# Patient Record
Sex: Male | Born: 1935 | Race: White | Hispanic: No
Health system: Southern US, Community
[De-identification: ages and names within clinical notes are randomized; demographics above are authoritative.]

## PROBLEM LIST (undated history)

## (undated) DIAGNOSIS — C73 Malignant neoplasm of thyroid gland: Secondary | ICD-10-CM

---

## 2012-08-26 ENCOUNTER — Other Ambulatory Visit (HOSPITAL_COMMUNITY): Payer: Self-pay | Admitting: Internal Medicine

## 2012-08-26 DIAGNOSIS — C73 Malignant neoplasm of thyroid gland: Secondary | ICD-10-CM

## 2012-09-02 ENCOUNTER — Encounter (HOSPITAL_COMMUNITY)
Admission: RE | Admit: 2012-09-02 | Discharge: 2012-09-02 | Disposition: A | Discharge status: Home / Self Care | Payer: Medicare Other | Source: Ambulatory Visit | Attending: Internal Medicine | Admitting: Internal Medicine

## 2012-09-02 DIAGNOSIS — C73 Malignant neoplasm of thyroid gland: Secondary | ICD-10-CM

## 2012-09-02 MED ORDER — THYROTROPIN ALFA 1.1 MG IM SOLR
0.9000 mg | INTRAMUSCULAR | Status: AC
  Administered 2012-09-02: 0.9 mg via INTRAMUSCULAR

## 2012-09-03 ENCOUNTER — Encounter (HOSPITAL_COMMUNITY): Payer: Medicare Other

## 2012-09-03 MED ORDER — THYROTROPIN ALFA 1.1 MG IM SOLR
0.9000 mg | INTRAMUSCULAR | Status: AC
  Administered 2012-09-03: 0.9 mg via INTRAMUSCULAR
  Filled 2012-09-03: qty 0.9

## 2012-09-04 ENCOUNTER — Encounter (HOSPITAL_COMMUNITY)
Admission: RE | Admit: 2012-09-04 | Discharge: 2012-09-04 | Disposition: A | Discharge status: Home / Self Care | Payer: Medicare Other | Source: Ambulatory Visit | Attending: Internal Medicine | Admitting: Internal Medicine

## 2012-09-04 MED ORDER — SODIUM IODIDE I 131 CAPSULE
72.8000 | Freq: Once | INTRAVENOUS | Status: AC | PRN
  Administered 2012-09-04: 72.8 via ORAL

## 2012-09-15 ENCOUNTER — Encounter (HOSPITAL_COMMUNITY)
Admission: RE | Admit: 2012-09-15 | Discharge: 2012-09-15 | Disposition: A | Discharge status: Home / Self Care | Payer: Medicare Other | Source: Ambulatory Visit | Attending: Internal Medicine | Admitting: Internal Medicine

## 2012-09-15 DIAGNOSIS — C73 Malignant neoplasm of thyroid gland: Secondary | ICD-10-CM | POA: Insufficient documentation

## 2012-09-16 ENCOUNTER — Other Ambulatory Visit (HOSPITAL_COMMUNITY): Payer: Self-pay | Admitting: Internal Medicine

## 2012-09-16 DIAGNOSIS — C73 Malignant neoplasm of thyroid gland: Secondary | ICD-10-CM

## 2012-09-16 DIAGNOSIS — C799 Secondary malignant neoplasm of unspecified site: Secondary | ICD-10-CM

## 2012-09-18 ENCOUNTER — Ambulatory Visit (HOSPITAL_COMMUNITY)
Admission: RE | Admit: 2012-09-18 | Discharge: 2012-09-18 | Disposition: A | Discharge status: Home / Self Care | Payer: Medicare Other | Source: Ambulatory Visit | Attending: Internal Medicine | Admitting: Internal Medicine

## 2012-09-18 DIAGNOSIS — C799 Secondary malignant neoplasm of unspecified site: Secondary | ICD-10-CM

## 2012-09-18 DIAGNOSIS — I6529 Occlusion and stenosis of unspecified carotid artery: Secondary | ICD-10-CM | POA: Insufficient documentation

## 2012-09-18 DIAGNOSIS — R599 Enlarged lymph nodes, unspecified: Secondary | ICD-10-CM | POA: Insufficient documentation

## 2012-09-18 DIAGNOSIS — C73 Malignant neoplasm of thyroid gland: Secondary | ICD-10-CM | POA: Insufficient documentation

## 2012-09-18 DIAGNOSIS — M47812 Spondylosis without myelopathy or radiculopathy, cervical region: Secondary | ICD-10-CM | POA: Insufficient documentation

## 2012-09-18 MED ORDER — IOHEXOL 300 MG/ML  SOLN
100.0000 mL | Freq: Once | INTRAMUSCULAR | Status: AC | PRN
  Administered 2012-09-18: 100 mL via INTRAVENOUS

## 2013-09-15 ENCOUNTER — Other Ambulatory Visit (HOSPITAL_COMMUNITY): Payer: Self-pay | Admitting: Internal Medicine

## 2013-09-15 DIAGNOSIS — C73 Malignant neoplasm of thyroid gland: Secondary | ICD-10-CM

## 2013-10-04 ENCOUNTER — Encounter (HOSPITAL_COMMUNITY)
Admission: RE | Admit: 2013-10-04 | Discharge: 2013-10-04 | Disposition: A | Discharge status: Home / Self Care | Payer: Medicare PPO | Source: Ambulatory Visit | Attending: Internal Medicine | Admitting: Internal Medicine

## 2013-10-04 ENCOUNTER — Ambulatory Visit (HOSPITAL_COMMUNITY)
Admission: RE | Admit: 2013-10-04 | Discharge: 2013-10-04 | Disposition: A | Discharge status: Home / Self Care | Payer: Medicare PPO | Source: Ambulatory Visit | Attending: Internal Medicine | Admitting: Internal Medicine

## 2013-10-04 DIAGNOSIS — C73 Malignant neoplasm of thyroid gland: Secondary | ICD-10-CM

## 2013-10-04 MED ORDER — THYROTROPIN ALFA 1.1 MG IM SOLR
0.9000 mg | INTRAMUSCULAR | Status: AC
  Administered 2013-10-04: 0.9 mg via INTRAMUSCULAR
  Filled 2013-10-04: qty 0.9

## 2013-10-05 ENCOUNTER — Encounter (HOSPITAL_COMMUNITY)
Admission: RE | Admit: 2013-10-05 | Discharge: 2013-10-05 | Disposition: A | Discharge status: Home / Self Care | Payer: Medicare PPO | Source: Ambulatory Visit | Attending: Internal Medicine | Admitting: Internal Medicine

## 2013-10-05 MED ORDER — THYROTROPIN ALFA 1.1 MG IM SOLR
0.9000 mg | INTRAMUSCULAR | Status: AC
  Administered 2013-10-05: 0.9 mg via INTRAMUSCULAR
  Filled 2013-10-05: qty 0.9

## 2013-10-06 ENCOUNTER — Encounter (HOSPITAL_COMMUNITY)
Admission: RE | Admit: 2013-10-06 | Discharge: 2013-10-06 | Disposition: A | Discharge status: Home / Self Care | Payer: Medicare PPO | Source: Ambulatory Visit | Attending: Internal Medicine | Admitting: Internal Medicine

## 2013-10-08 ENCOUNTER — Encounter (HOSPITAL_COMMUNITY): Payer: Self-pay

## 2013-10-08 ENCOUNTER — Ambulatory Visit (HOSPITAL_COMMUNITY)
Admission: RE | Admit: 2013-10-08 | Discharge: 2013-10-08 | Disposition: A | Discharge status: Home / Self Care | Payer: Medicare PPO | Source: Ambulatory Visit | Attending: Internal Medicine | Admitting: Internal Medicine

## 2013-10-08 DIAGNOSIS — C73 Malignant neoplasm of thyroid gland: Secondary | ICD-10-CM | POA: Insufficient documentation

## 2013-10-08 HISTORY — DX: Malignant neoplasm of thyroid gland: C73

## 2013-10-08 MED ORDER — SODIUM IODIDE I 131 CAPSULE
4.2000 | Freq: Once | INTRAVENOUS | Status: AC | PRN
  Administered 2013-10-08: 4.2 via ORAL

## 2013-10-11 LAB — THYROGLOBULIN LEVEL: Thyroglobulin: <0.2 ng/mL (ref 0.0–55.0)

## 2013-10-11 LAB — THYROGLOBULIN ANTIBODY: Thyroglobulin Ab: <20 IU/mL (ref <40.0)

## 2014-05-27 IMAGING — CT CT NECK W/ CM
5 of 7 series · 15 of 33 positions shown, 16 images · IV contrast (APPLIED)
Comparison: No prior relevant CT.  Whole body I 131 scan
09/15/2012.

CT NECK

CLINICAL DATA: Metastatic thyroid cancer with left axillary
adenopathy and abnormal whole body I 131 scan.  Total thyroidectomy
06/19/2012 with radioactive iodine ablation.

CT NECK AND CHEST WITH CONTRAST
TECHNIQUE: Multidetector CT imaging of the neck and chest was
performed using the standard protocol after bolus administration of
intravenous contrast.
Contrast: 100mL OMNIPAQUE IOHEXOL 300 MG/ML  SOLN

[Series 3: cap 5.0 st · axial · 0.76mm/px · z∈[-458,-338]mm · 2 of 73 slices shown, 3 images]
[im 25/73  soft-tissue]
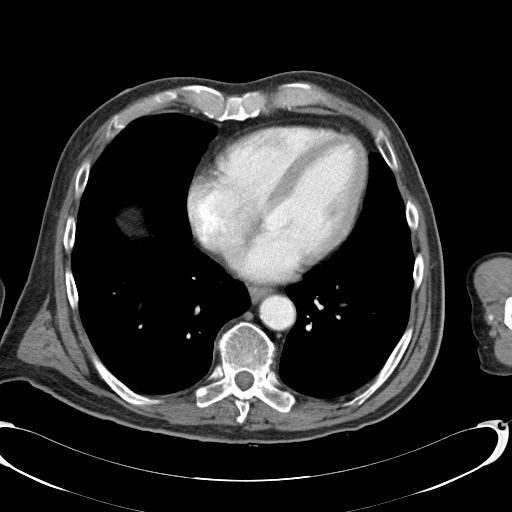
[im 25/73  bone]
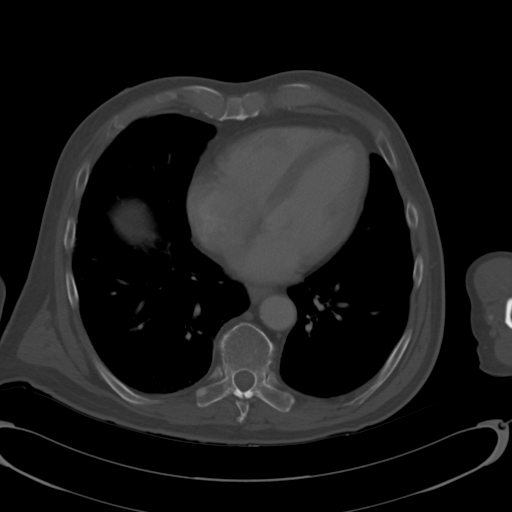
[im 49/73  bone]
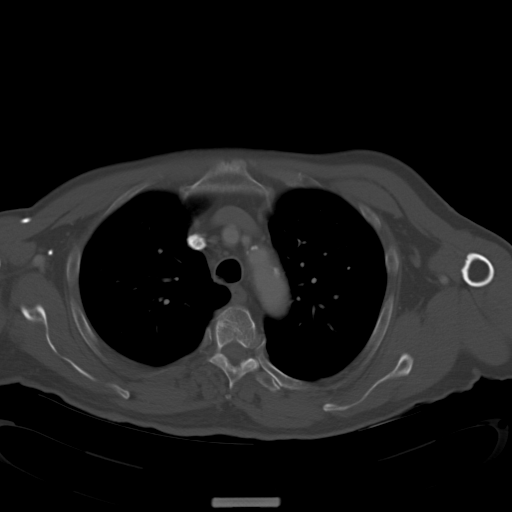

[Series 6: coronals · coronal · 0.61mm/px · 2 of 116 slices shown]
[im 39/116  bone]
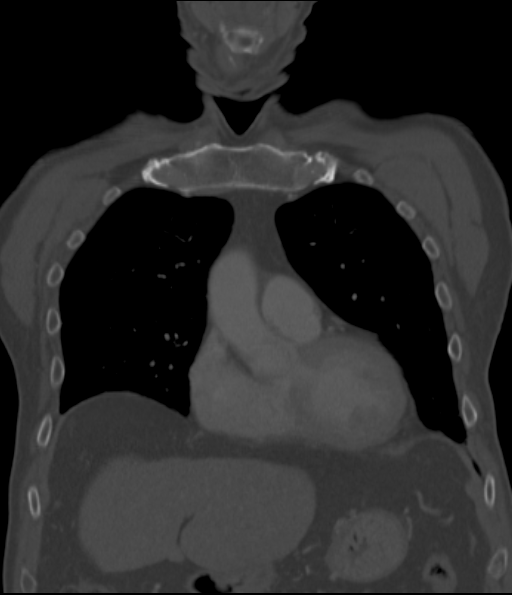
[im 77/116  bone]
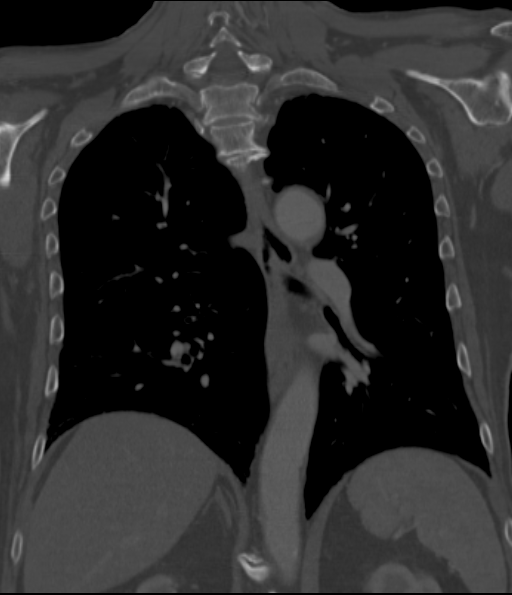

[Series 7: sagittals · sagittal · 0.71mm/px · 5 of 142 slices shown]
[im 36/142  bone]
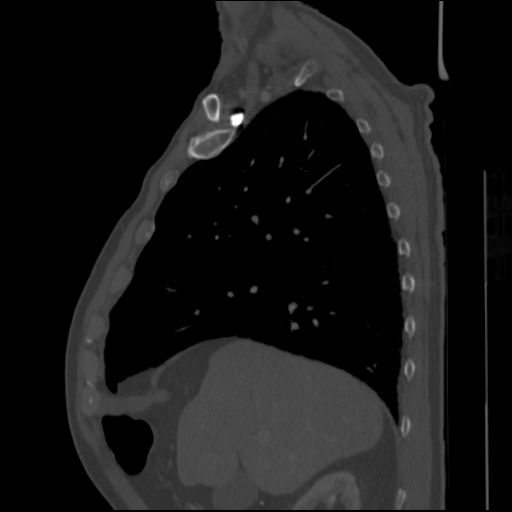
[im 53/142  bone]
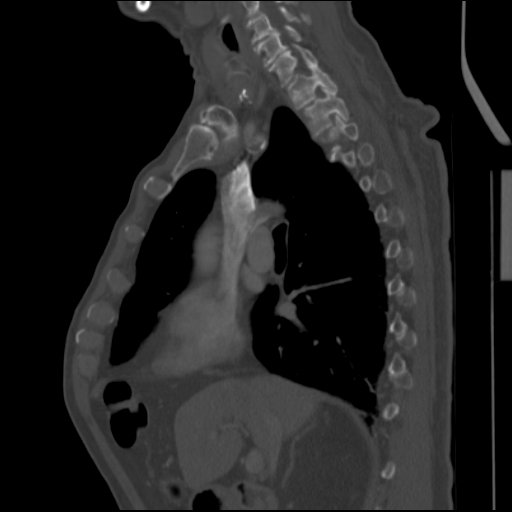
[im 71/142  bone]
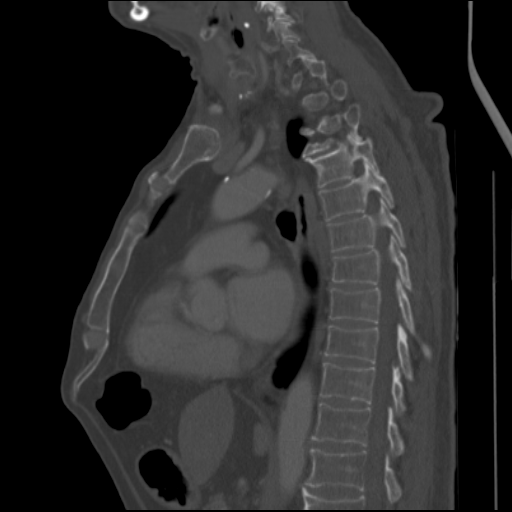
[im 89/142  bone]
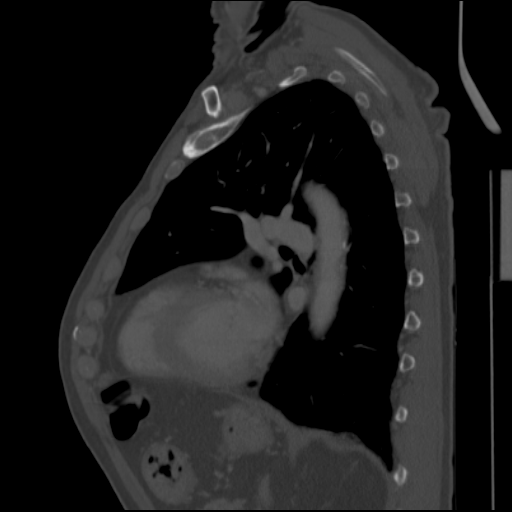
[im 106/142  bone]
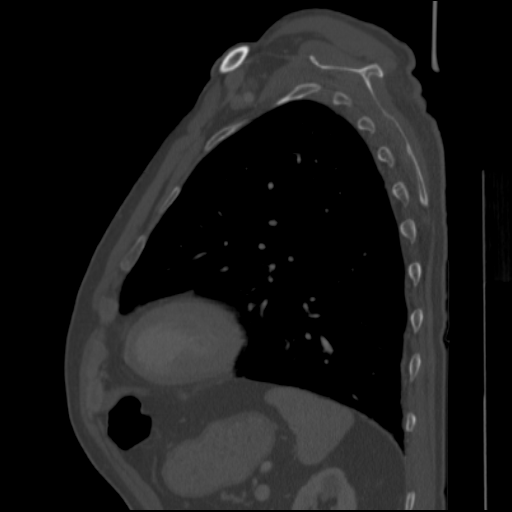

[Series 8: st neck 2.0 b31s · axial · 0.49mm/px · z∈[-266,-132]mm · 4 of 113 slices shown]
[im 23/113  bone]
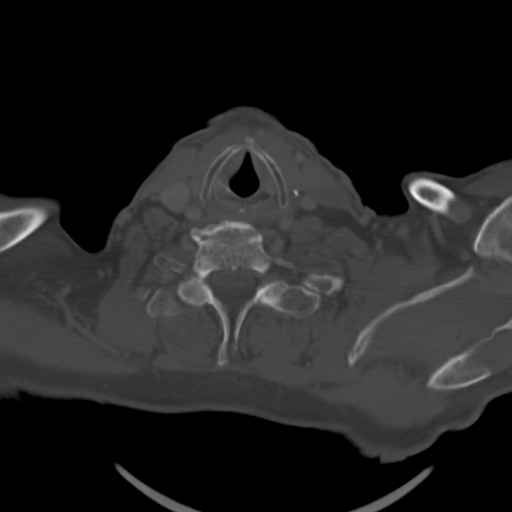
[im 45/113  bone]
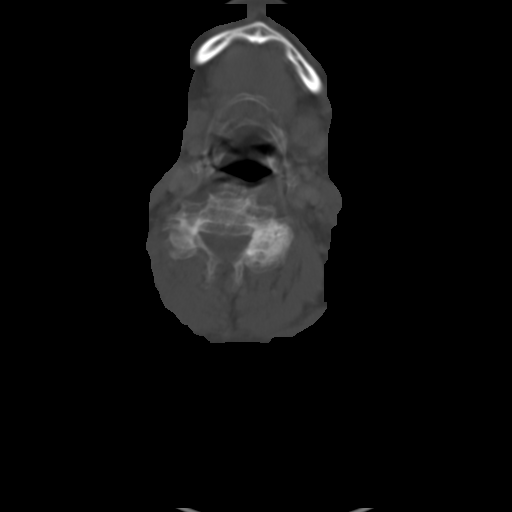
[im 68/113  bone]
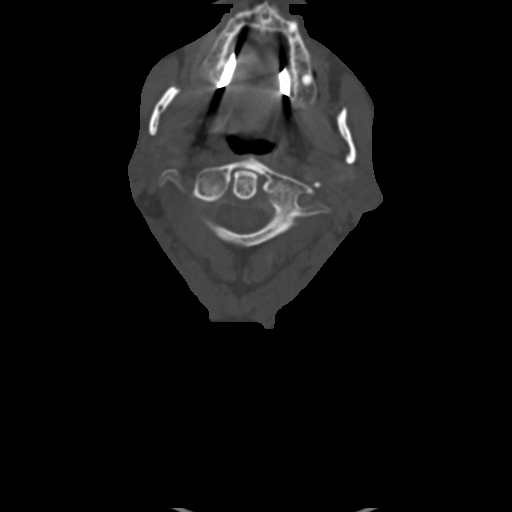
[im 90/113  bone]
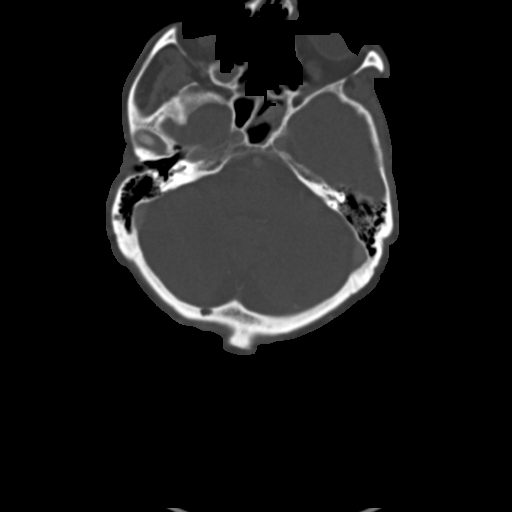

[Series 604: orthgonal · axial · 0.49mm/px · z∈[-292,-239]mm · 2 of 86 slices shown]
[im 29/86  bone]
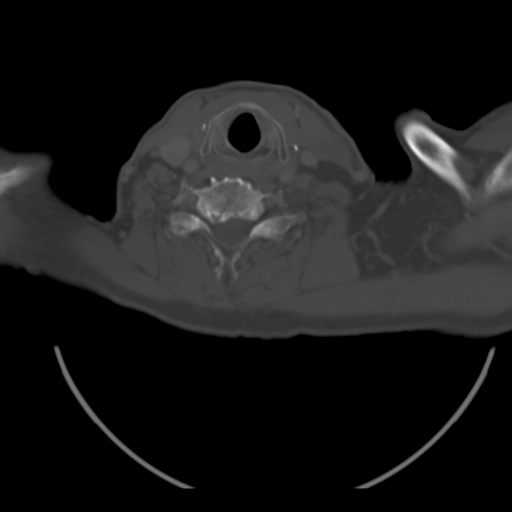
[im 57/86  bone]
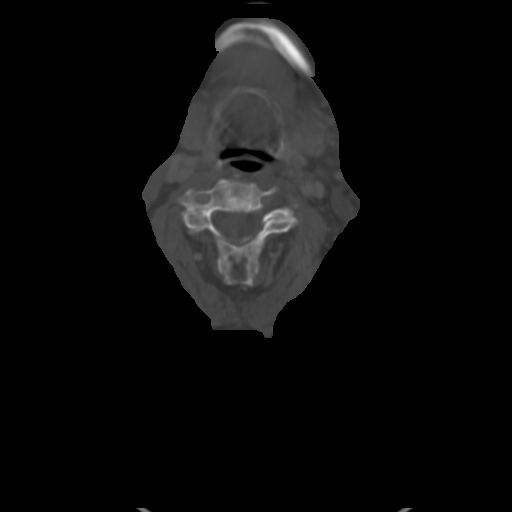

[15 of 33 positions shown; findings below may reference images not displayed]

FINDINGS: Mild motion artifact is noted.  There are postsurgical
changes in the lower neck status post thyroidectomy.  Some
paratracheal soft tissue adjacent to the surgical clips is likely
postoperative change, and no well-defined mass or fluid collection
is identified.  There are no enlarged cervical lymph nodes.  Small
left supraclavicular lymph nodes are not pathologically enlarged.

The parotid and submandibular glands appear normal bilaterally.  No
lesions of the pharyngeal mucosal space are noted.  The visualized
intracranial contents are unremarkable.  The right maxillary sinus
is opacified.

There is mild carotid atherosclerosis and moderate cervical
spondylosis.
IMPRESSION: 1.  No obvious paratracheal mass or enlarged cervical lymph nodes
status post thyroidectomy.
2.  Opacified right maxillary sinus and cervical spondylosis noted.

CT CHEST
FINDINGS: As above, there are expected postsurgical changes at the
thoracic inlet related to recent thyroidectomy.  No focal mass is
demonstrated.  There are no enlarged mediastinal, hilar or axillary
lymph nodes.  Small superior mediastinal lymph nodes are not
pathologically enlarged.

There is mild atherosclerosis of the aorta, great vessels and
coronary arteries.  There is no pleural or pericardial effusion.

The lungs are clear without suspicious nodularity.  There are no
worrisome osseous findings.  The visualized upper abdomen is
unremarkable.
IMPRESSION: 1.  No evidence of paratracheal mass or superior mediastinal
adenopathy to suggest metastatic disease.  The previous I 131 scan
findings may have been artifactual (possibly related to motion?).
Microscopic disease is not completely excluded, and clinical follow-
up of the patient's thyroglobulin levels is recommended.
2.  No acute chest findings demonstrated.

## 2015-07-22 DEATH — deceased
